# Patient Record
Sex: Male | Born: 1990 | Race: White | Hispanic: No | Marital: Single | Smoking: Never smoker
Health system: Southern US, Community
[De-identification: ages and names within clinical notes are randomized; demographics above are authoritative.]

---
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
## 2009-01-10 IMAGING — CR DG CHEST 2V
2 series · 2 of 2 positions shown · non-contrast
Comparison: No prior studies are available for comparison.

CLINICAL DATA: Dyspnea.
CHEST - 2 VIEW:

[view not recorded (1 of 2)]
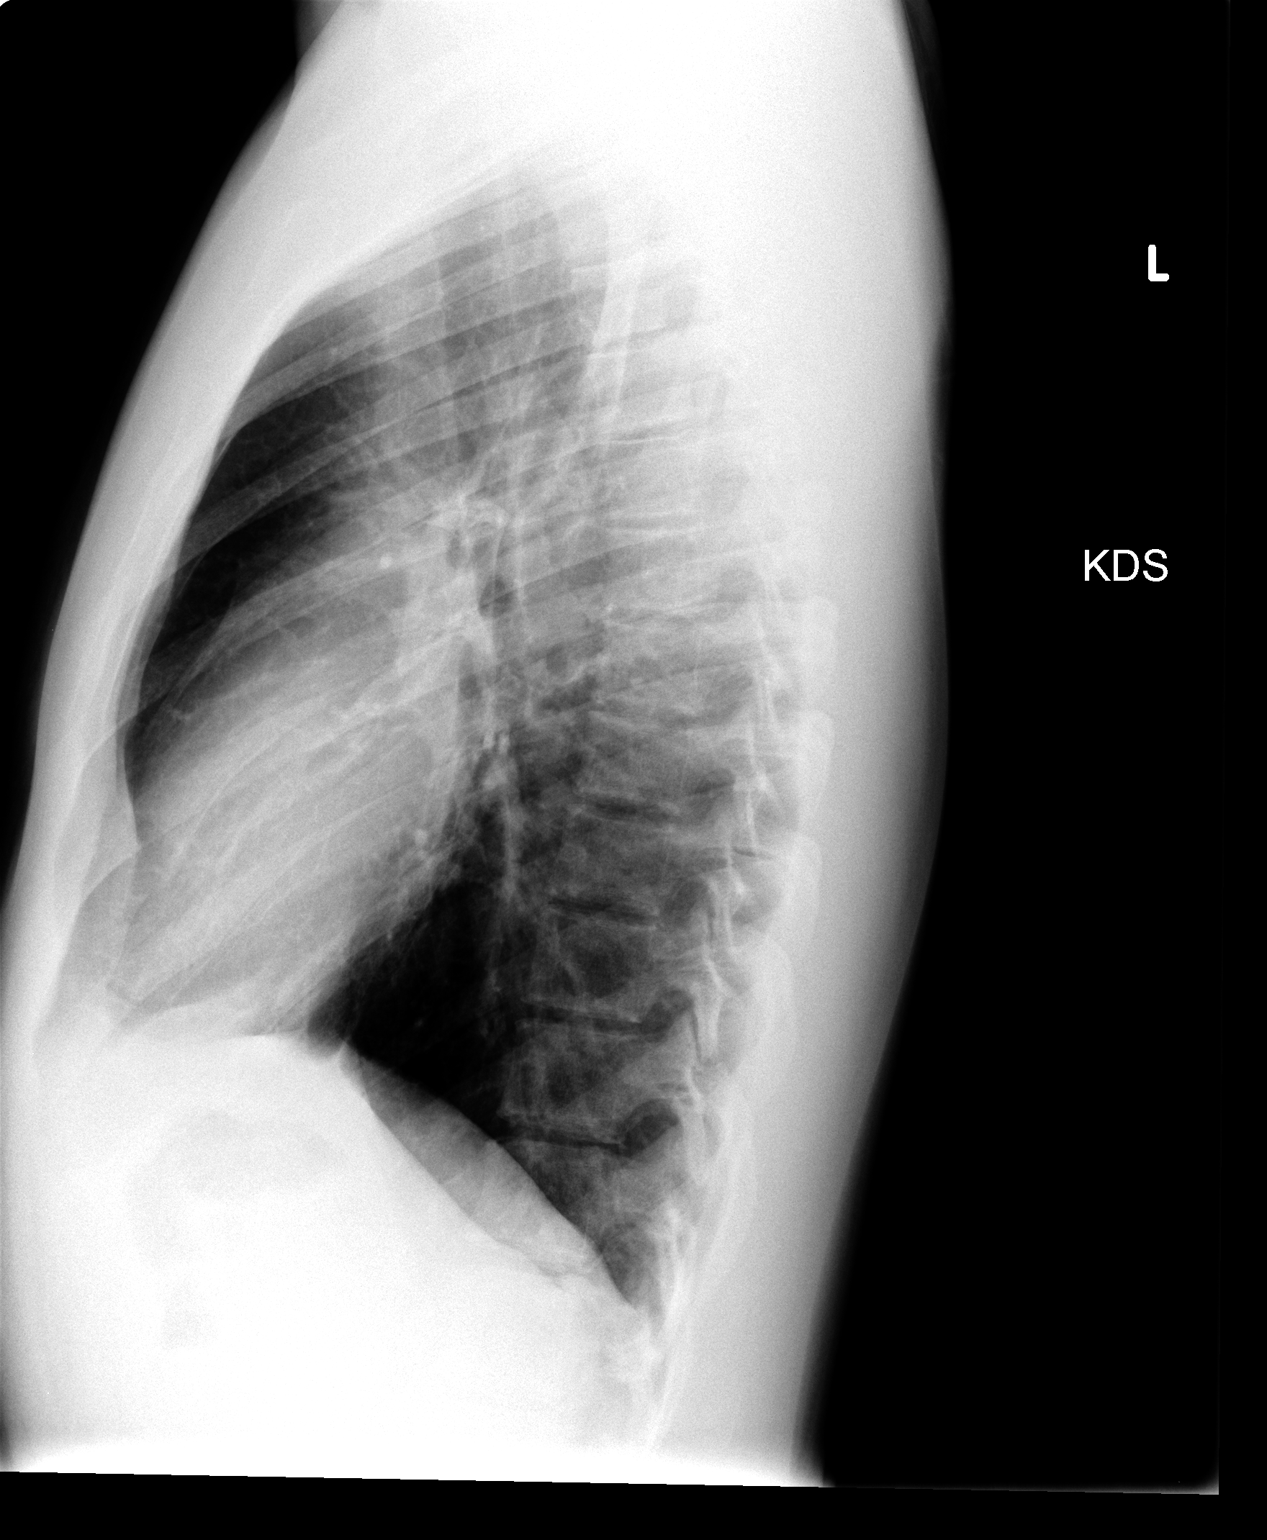

[view not recorded (2 of 2)]
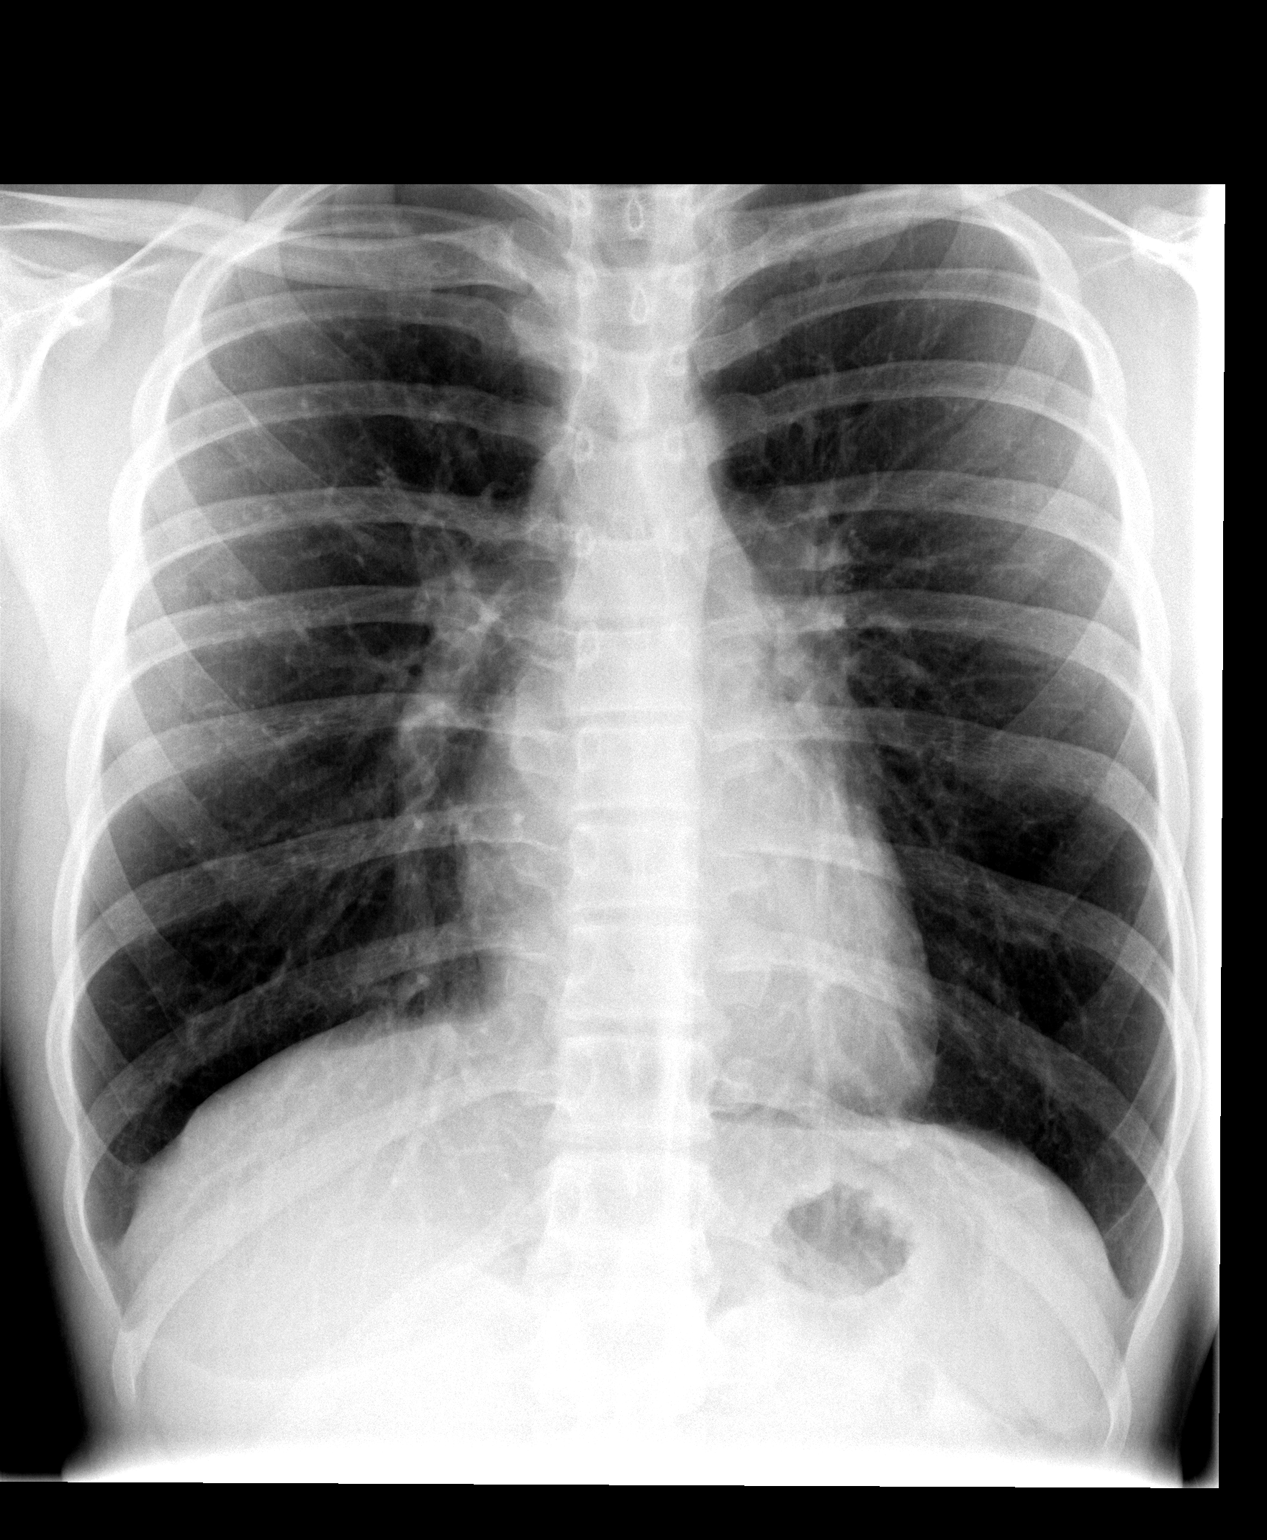

[2 of 2 positions shown; findings below may reference images not displayed]

FINDINGS: The cardiomediastinal silhouette is unremarkable. Mild peribronchial thickening is present without focal air space disease, pleural effusions, or pneumothorax. The bony thorax is within normal limits.
IMPRESSION: Mild peribronchial thickening without focal air space disease.

## 2011-05-17 NOTE — Procedures (Signed)
NAMEIZAC, FAULKENBERRY           ACCOUNT NO.:  [id number]   MEDICAL RECORD NO.:  [id number]          PATIENT TYPE:  OUT   LOCATION:  RESP                          FACILITY:  [facility]   PHYSICIAN:  Edward L. Juanetta Gosling, M.D.DATE OF BIRTH:  10/20/1991   DATE OF PROCEDURE:  DATE OF DISCHARGE:  02/09/2008                            PULMONARY FUNCTION TEST   Please note the technician's notes about the difficulty with the  patient's ability to perform full spirometry but spirometry shows no  ventilatory defect.  No evidence of airflow obstruction.  DLCO is  normal.  Lung volumes could not be determined.      Edward L. Juanetta Gosling, M.D.  Electronically Signed     [facility]/MEDQ  D:  02/12/2008  T:  02/13/2008  Job:  [id number]   cc:   Laverle Hobby, M.D.  [address]  [place], [place] [address]

## 2011-09-23 LAB — DIFFERENTIAL
Basophils Absolute: 0
Basophils Relative: 0
Eosinophils Absolute: 0.1
Eosinophils Relative: 1
Lymphocytes Relative: 26
Lymphs Abs: 2.4
Monocytes Absolute: 0.7
Monocytes Relative: 8
Neutro Abs: 6.2
Neutrophils Relative %: 66

## 2011-09-23 LAB — COMPREHENSIVE METABOLIC PANEL
ALT: 21
AST: 25
CO2: 19
Chloride: 109
Glucose, Bld: 104 — ABNORMAL HIGH
Sodium: 137
Total Bilirubin: 0.8

## 2011-09-23 LAB — BLOOD GAS, ARTERIAL
Acid-base deficit: 5.7 — ABNORMAL HIGH
O2 Saturation: 99.1
Patient temperature: 37
TCO2: 13.3
pCO2 arterial: 16 — CL

## 2011-09-23 LAB — D-DIMER, QUANTITATIVE: D-Dimer, Quant: 0.22

## 2011-09-23 LAB — CBC
Hemoglobin: 14.9
MCV: 89
RBC: 4.75
WBC: 9.4

## 2019-11-09 ENCOUNTER — Other Ambulatory Visit: Payer: Self-pay | Admitting: Cardiology

## 2019-11-09 DIAGNOSIS — Z20822 Contact with and (suspected) exposure to covid-19: Secondary | ICD-10-CM

## 2019-11-11 LAB — NOVEL CORONAVIRUS, NAA: SARS-CoV-2, NAA: NOT DETECTED

## 2023-04-12 ENCOUNTER — Other Ambulatory Visit: Payer: Self-pay

## 2023-04-12 ENCOUNTER — Encounter (HOSPITAL_BASED_OUTPATIENT_CLINIC_OR_DEPARTMENT_OTHER): Payer: Self-pay

## 2023-04-12 ENCOUNTER — Emergency Department (HOSPITAL_BASED_OUTPATIENT_CLINIC_OR_DEPARTMENT_OTHER)
Admission: EM | Admit: 2023-04-12 | Discharge: 2023-04-12 | Disposition: A | Discharge status: Home / Self Care | Attending: Emergency Medicine | Admitting: Emergency Medicine

## 2023-04-12 ENCOUNTER — Emergency Department (HOSPITAL_BASED_OUTPATIENT_CLINIC_OR_DEPARTMENT_OTHER): Admitting: Radiology

## 2023-04-12 DIAGNOSIS — Z1152 Encounter for screening for COVID-19: Secondary | ICD-10-CM | POA: Insufficient documentation

## 2023-04-12 DIAGNOSIS — R0602 Shortness of breath: Secondary | ICD-10-CM | POA: Diagnosis present

## 2023-04-12 DIAGNOSIS — R052 Subacute cough: Secondary | ICD-10-CM

## 2023-04-12 DIAGNOSIS — J40 Bronchitis, not specified as acute or chronic: Secondary | ICD-10-CM | POA: Diagnosis not present

## 2023-04-12 DIAGNOSIS — J069 Acute upper respiratory infection, unspecified: Secondary | ICD-10-CM | POA: Diagnosis not present

## 2023-04-12 LAB — RESP PANEL BY RT-PCR (RSV, FLU A&B, COVID)  RVPGX2
Influenza A by PCR: NEGATIVE
Influenza B by PCR: NEGATIVE
Resp Syncytial Virus by PCR: NEGATIVE
SARS Coronavirus 2 by RT PCR: NEGATIVE

## 2023-04-12 MED ORDER — ALBUTEROL SULFATE HFA 108 (90 BASE) MCG/ACT IN AERS: 1.0000 | INHALATION_SPRAY | Freq: Four times a day (QID) | RESPIRATORY_TRACT | 0 refills | Status: AC | PRN

## 2023-04-12 MED ORDER — DOXYCYCLINE HYCLATE 100 MG PO CAPS: 100.0000 mg | ORAL_CAPSULE | Freq: Two times a day (BID) | ORAL | 0 refills | Status: AC

## 2023-04-12 NOTE — ED Notes (Signed)
Dc instructions reviewed with patient. Patient voiced understanding. Dc with belongings.  °

## 2023-04-12 NOTE — Discharge Instructions (Addendum)
You were seen for symptoms that we believe are related to bronchitis.  Your chest x-ray was overall reassuring and showed no pneumonia.  Your viral swabs were also negative.  I have given you a course of antibiotics called doxycycline.  Take the whole course until it is complete.  You can also use the albuterol inhaler as needed for wheezing or shortness of breath.  Drink plenty of fluids and take Tylenol and ibuprofen as needed for fever or discomfort.  Follow-up with your primary care doctor regarding your visit to the ER today.  Come back if any severe worsening shortness of breath, fainting, chest pain, coughing up blood, or any other symptoms concerning to you.

## 2023-04-12 NOTE — ED Triage Notes (Signed)
He reports uri sx x ~ 10 days. He states he has fine wheezes at times. He denies hx of asthma.

## 2023-04-12 NOTE — ED Provider Triage Note (Signed)
Emergency Medicine Provider Triage Evaluation Note  Keith Sanchez , a 32 y.o. male  was evaluated in triage.  Pt complains of " feels like my breathing is involuntary".  Started 3 days ago, he feels like he has to "think about taking a deep breath".  He is coughing, denies any chest pain but does feel short of breath..  Review of Systems  Per HPI  Physical Exam  There were no vitals taken for this visit. Gen:   Awake, no distress   Resp:  Normal effort  MSK:   Moves extremities without difficulty  Other:  No tachypnea, talking complete sentences.  Medical Decision Making  Medically screening exam initiated at 11:22 AM.  Appropriate orders placed.  Keith Sanchez was informed that the remainder of the evaluation will be completed by another provider, this initial triage assessment does not replace that evaluation, and the importance of remaining in the ED until their evaluation is complete.     Keith Arista, PA-C 04/12/23 1123

## 2023-04-12 NOTE — ED Provider Notes (Signed)
[place] EMERGENCY DEPARTMENT AT [facility] Provider Note   CSN: [id number] Arrival date & time: 04/12/23  1108     History  Chief Complaint  Patient presents with   URI    Keith Sanchez is a 32 y.o. male.  With no significant past medical history who presents with ongoing cough and shortness of breath over the past 2 weeks.  Patient said symptoms started about 2 weeks ago with cough which initially improved but then returned and worsened with cough productive of green-yellow sputum.  She also notes mild shortness of breath with trying to sleep at night or walking around.  No associated chest pain, fevers, chills, sore throat, congestion or rhinorrhea.  Has had history of previous pneumonia and this feels similar.  No leg pain or swelling.  No history of PE or DVT.  No history of asthma or lung disease.  No hemoptysis reported.  HPI     Home Medications Prior to Admission medications   Medication Sig Start Date End Date Taking? Authorizing Provider  albuterol (VENTOLIN HFA) 108 (90 Base) MCG/ACT inhaler Inhale 1-2 puffs into the lungs every 6 (six) hours as needed for wheezing or shortness of breath. 04/12/23  Yes Mardene Sayer, MD  doxycycline (VIBRAMYCIN) 100 MG capsule Take 1 capsule (100 mg total) by mouth 2 (two) times daily. 04/12/23  Yes Mardene Sayer, MD      Allergies    Patient has no known allergies.    Review of Systems   Review of Systems  Physical Exam Updated Vital Signs BP (!) 142/67 (BP Location: Right Arm)   Pulse 88   Temp 97.8 F (36.6 C) (Oral)   Resp 16   SpO2 100%  Physical Exam Constitutional: Alert and oriented. Well appearing and in no distress. Eyes: Conjunctivae are normal. ENT      Head: Normocephalic and atraumatic.      Nose: No congestion.      Mouth/Throat: Mucous membranes are moist.  Uvula midline.  No oropharyngeal erythema or exudate.  No drooling.      Neck: No stridor. Cardiovascular: S1, S2, regular  rate and rhythm Respiratory: Normal respiratory effort. Breath sounds are normal.  O2 sat 100 on RA Gastrointestinal: Nondistended Musculoskeletal: Normal range of motion in all extremities.      Right lower leg: No tenderness or edema.      Left lower leg: No tenderness or edema. Neurologic: Normal speech and language. No gross focal neurologic deficits are appreciated. Skin: Skin is warm, dry and intact. No rash noted. Psychiatric: Mood and affect are normal. Speech and behavior are normal.  ED Results / Procedures / Treatments   Labs (all labs ordered are listed, but only abnormal results are displayed) Labs Reviewed  RESP PANEL BY RT-PCR (RSV, FLU A&B, COVID)  [id number]    EKG None  Radiology DG Chest 2 View  Result Date: 04/12/2023 CLINICAL DATA:  Cough, shortness of breath. EXAM: CHEST - 2 VIEW COMPARISON:  March 08, 2008. FINDINGS: The heart size and mediastinal contours are within normal limits. Both lungs are clear. The visualized skeletal structures are unremarkable. IMPRESSION: No active cardiopulmonary disease. Electronically Signed   By: Lupita Raider M.D.   On: 04/12/2023 12:20    Procedures Procedures    Medications Ordered in ED Medications - No data to display  ED Course/ Medical Decision Making/ A&P  Medical Decision Making Keith Sanchez is a 32 y.o. male.  With no significant past medical history who presents with ongoing cough and shortness of breath over the past 2 weeks.   Patient presents nontoxic with no increased work of breathing and no hypoxia.  He is hemodynamically stable.  His presentation is likely related to viral URI versus bronchitis versus bacterial pneumonia.  I am clinically not concern for sepsis.  I am not concerned for PE as patient is PERC negative.  Chest x-ray obtained which I personally reviewed which showed no pneumonia, no pneumothorax, no pulmonary edema.  RVP negative.  Suspect patient's  symptoms are related to bronchitis.  Will give course of doxycycline due to ongoing symptoms for 2 weeks and productive cough as well as as needed albuterol inhaler.  Advise follow-up with PCP and strict return precautions.  He is in agreement with plan and discharged in good condition.  Amount and/or Complexity of Data Reviewed Radiology: ordered.  Risk Prescription drug management.     Final Clinical Impression(s) / ED Diagnoses Final diagnoses:  Subacute cough  Bronchitis    Rx / DC Orders ED Discharge Orders          Ordered    albuterol (VENTOLIN HFA) 108 (90 Base) MCG/ACT inhaler  Every 6 hours PRN        04/12/23 1226    doxycycline (VIBRAMYCIN) 100 MG capsule  2 times daily        04/12/23 1226              Mardene Sayer, MD 04/12/23 1227
